# Patient Record
Sex: Male | Born: 1985 | Race: White | Hispanic: No | Marital: Married | Smoking: Never smoker
Health system: Southern US, Community
[De-identification: ages and names within clinical notes are randomized; demographics above are authoritative.]

---
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
## 2011-04-02 ENCOUNTER — Ambulatory Visit (INDEPENDENT_AMBULATORY_CARE_PROVIDER_SITE_OTHER): Payer: Self-pay

## 2011-04-02 ENCOUNTER — Inpatient Hospital Stay (INDEPENDENT_AMBULATORY_CARE_PROVIDER_SITE_OTHER)
Admission: RE | Admit: 2011-04-02 | Discharge: 2011-04-02 | Disposition: A | Discharge status: Home / Self Care | Payer: Self-pay | Source: Ambulatory Visit | Attending: Emergency Medicine | Admitting: Emergency Medicine

## 2011-04-02 DIAGNOSIS — M799 Soft tissue disorder, unspecified: Secondary | ICD-10-CM

## 2011-04-02 DIAGNOSIS — R0789 Other chest pain: Secondary | ICD-10-CM

## 2012-10-14 IMAGING — CR DG CHEST 2V
2 series · 2 of 2 positions shown · non-contrast
Comparison: None.

CLINICAL DATA: Posterior left chest pain.

CHEST - 2 VIEW

[view not recorded (1 of 2)]
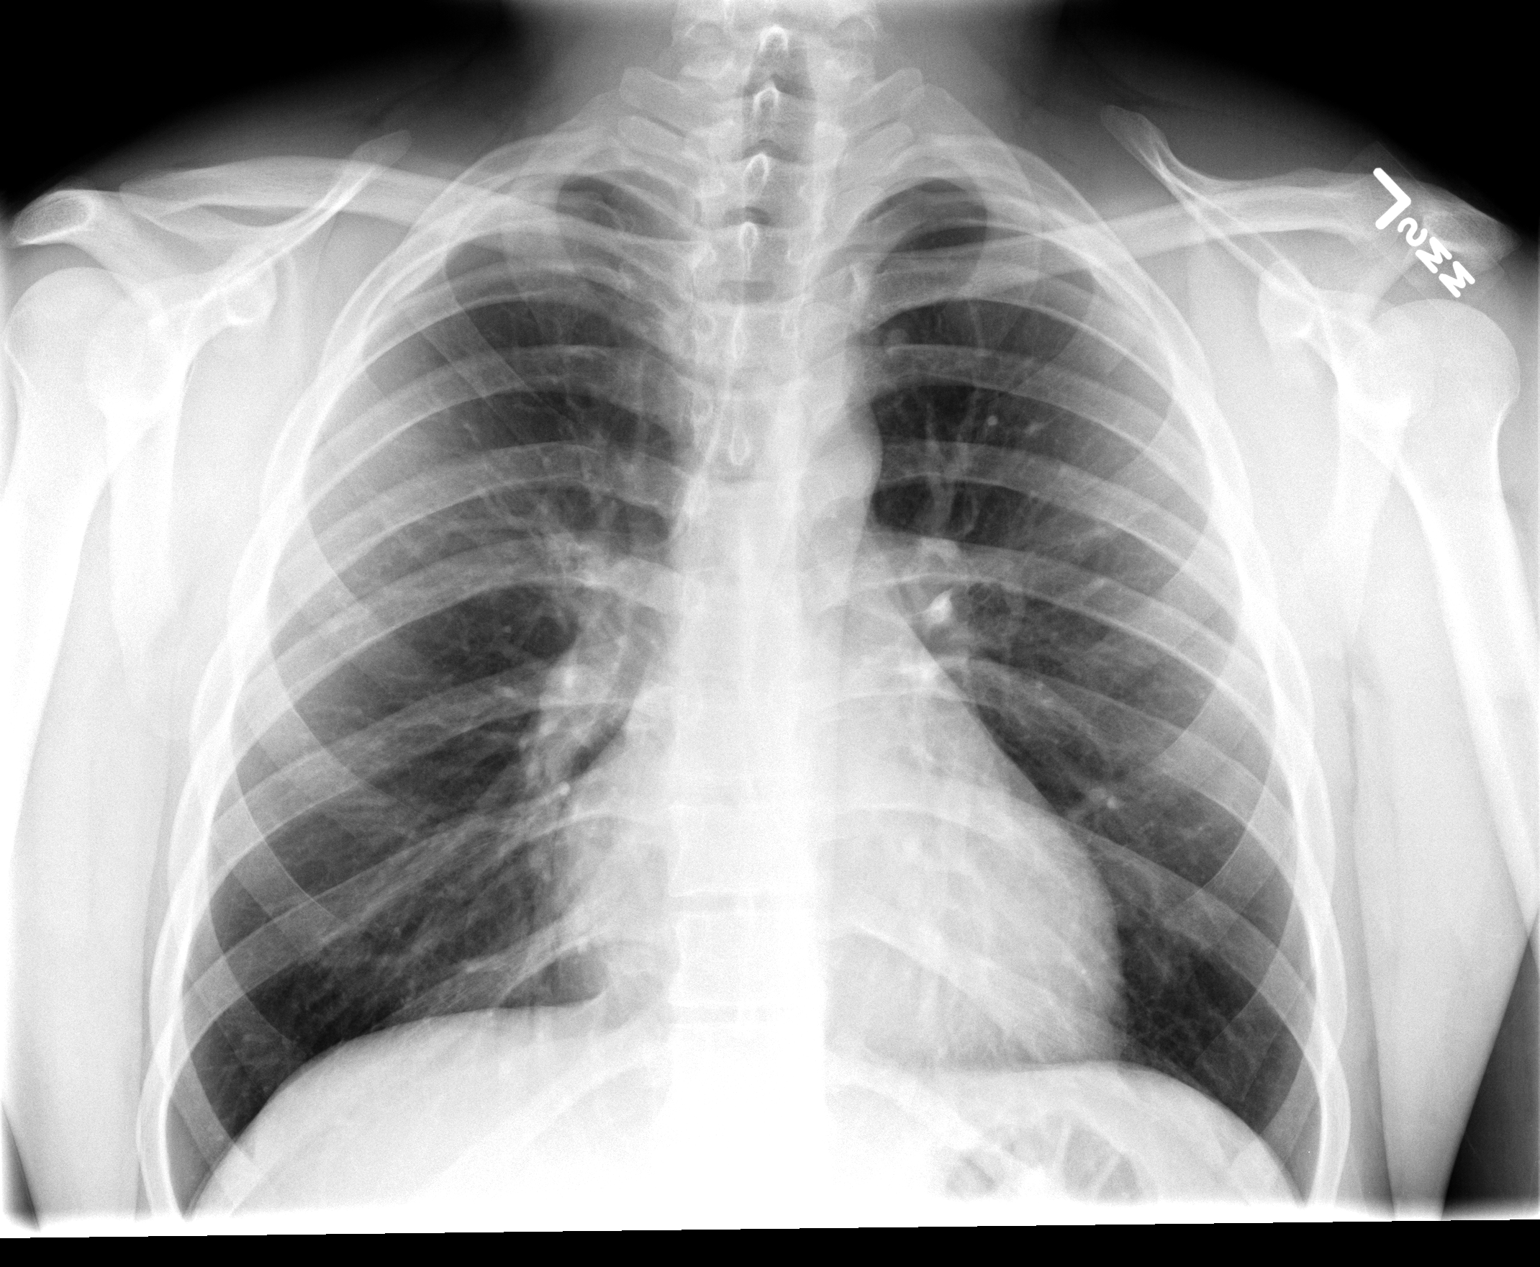

[view not recorded (2 of 2)]
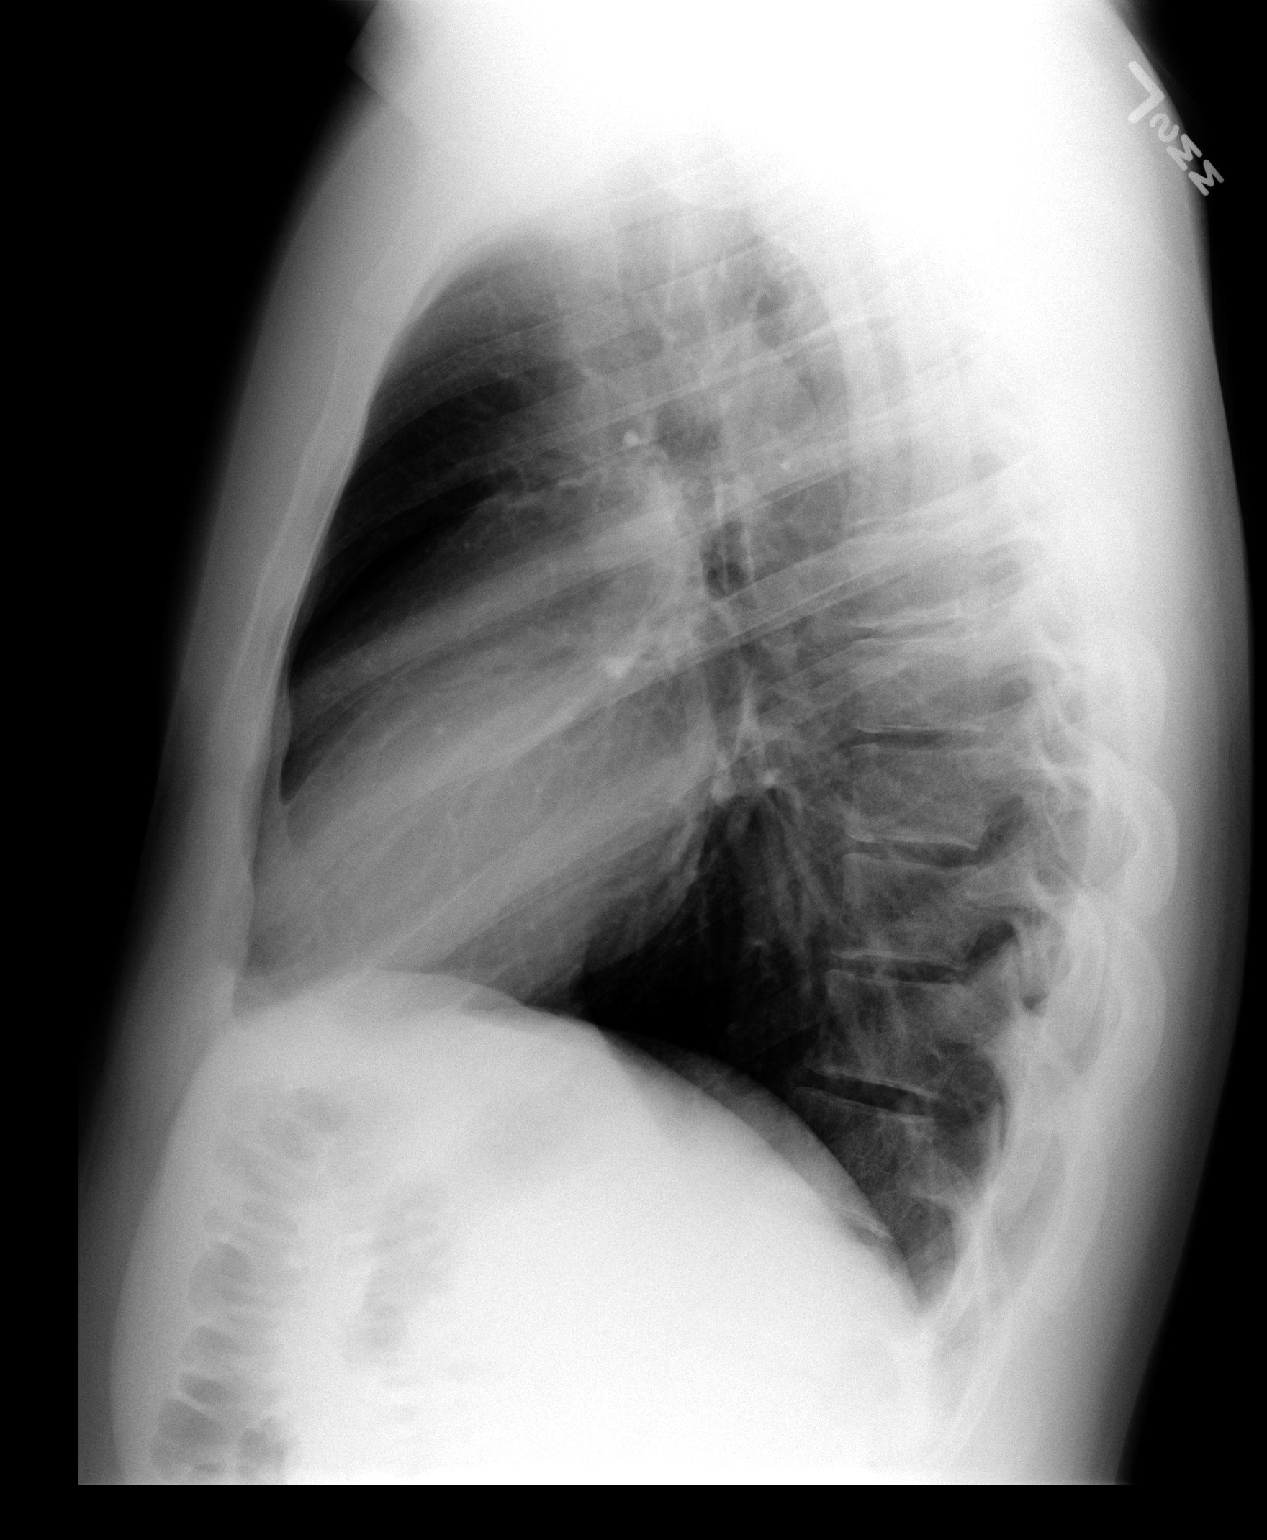

[2 of 2 positions shown; findings below may reference images not displayed]

FINDINGS: The heart size and mediastinal contours are normal. The
lungs are clear. There is no pleural effusion or pneumothorax. No
acute osseous findings are identified.
IMPRESSION: No active cardiopulmonary process.

## 2020-07-17 ENCOUNTER — Ambulatory Visit (HOSPITAL_COMMUNITY)
Admission: EM | Admit: 2020-07-17 | Discharge: 2020-07-17 | Disposition: A | Discharge status: Home / Self Care | Payer: Self-pay

## 2020-07-17 ENCOUNTER — Encounter (HOSPITAL_COMMUNITY): Payer: Self-pay | Admitting: Pediatrics

## 2020-07-17 ENCOUNTER — Other Ambulatory Visit: Payer: Self-pay

## 2020-07-17 ENCOUNTER — Emergency Department (HOSPITAL_COMMUNITY)
Admission: EM | Admit: 2020-07-17 | Discharge: 2020-07-17 | Disposition: A | Discharge status: Home / Self Care | Payer: No Typology Code available for payment source | Attending: Emergency Medicine | Admitting: Emergency Medicine

## 2020-07-17 ENCOUNTER — Encounter (HOSPITAL_COMMUNITY): Payer: Self-pay

## 2020-07-17 ENCOUNTER — Emergency Department (HOSPITAL_COMMUNITY): Payer: No Typology Code available for payment source

## 2020-07-17 DIAGNOSIS — Z5321 Procedure and treatment not carried out due to patient leaving prior to being seen by health care provider: Secondary | ICD-10-CM | POA: Diagnosis not present

## 2020-07-17 DIAGNOSIS — R112 Nausea with vomiting, unspecified: Secondary | ICD-10-CM | POA: Insufficient documentation

## 2020-07-17 DIAGNOSIS — R109 Unspecified abdominal pain: Secondary | ICD-10-CM | POA: Insufficient documentation

## 2020-07-17 LAB — COMPREHENSIVE METABOLIC PANEL
ALT: 19 U/L (ref 0–44)
AST: 32 U/L (ref 15–41)
Albumin: 4.1 g/dL (ref 3.5–5.0)
Alkaline Phosphatase: 28 U/L — ABNORMAL LOW (ref 38–126)
Anion gap: 13 (ref 5–15)
BUN: 16 mg/dL (ref 6–20)
CO2: 22 mmol/L (ref 22–32)
Calcium: 9 mg/dL (ref 8.9–10.3)
Chloride: 98 mmol/L (ref 98–111)
Creatinine, Ser: 1.54 mg/dL — ABNORMAL HIGH (ref 0.61–1.24)
GFR calc Af Amer: >60 mL/min (ref >60)
GFR calc non Af Amer: 59 mL/min — ABNORMAL LOW (ref >60)
Glucose, Bld: 111 mg/dL — ABNORMAL HIGH (ref 70–99)
Potassium: 3.9 mmol/L (ref 3.5–5.1)
Sodium: 133 mmol/L — ABNORMAL LOW (ref 135–145)
Total Bilirubin: 1.2 mg/dL (ref 0.3–1.2)
Total Protein: 7.8 g/dL (ref 6.5–8.1)

## 2020-07-17 LAB — CBC
HCT: 46.1 % (ref 39.0–52.0)
Hemoglobin: 15.3 g/dL (ref 13.0–17.0)
MCH: 28.2 pg (ref 26.0–34.0)
MCHC: 33.2 g/dL (ref 30.0–36.0)
MCV: 84.9 fL (ref 80.0–100.0)
Platelets: 178 10*3/uL (ref 150–400)
RBC: 5.43 MIL/uL (ref 4.22–5.81)
RDW: 12 % (ref 11.5–15.5)
WBC: 6.6 10*3/uL (ref 4.0–10.5)
nRBC: 0 % (ref 0.0–0.2)

## 2020-07-17 LAB — LIPASE, BLOOD: Lipase: 29 U/L (ref 11–51)

## 2020-07-17 MED ORDER — ONDANSETRON 4 MG PO TBDP: 4.0000 mg | ORAL_TABLET | Freq: Once | ORAL | Status: DC | PRN

## 2020-07-17 NOTE — ED Provider Notes (Signed)
Patient evaluated briefly at triage.  He is greater than 1 week history of having tested positive for COVID-19 and is not doing well.  Reports that he is not able to hold food and drinks well, has significant body aches.  In clinic has fevers, tachycardia and pulse oximetry of 93%.  Recommended further evaluation in the emergency room as he is having signs of Covid complications despite being an otherwise healthy individual.   Wallis Bamberg, PA-C 07/17/20 1651

## 2020-07-17 NOTE — ED Notes (Signed)
Patient is being discharged from the Urgent Care and sent to the Emergency Department via POV. Per Urban Gibson, patient is in need of higher level of care due to covid positive, worsening symptoms, febrile, unable to eat or drink x 4 days. Patient is aware and verbalizes understanding of plan of care.  Vitals:   07/17/20 1646  BP: 122/86  Pulse: (!) 110  Resp: 20  Temp: (!) 101.8 F (38.8 C)  SpO2: 93%

## 2020-07-17 NOTE — ED Triage Notes (Signed)
Pt presents with complaints of being on day 8 of covid. Reports he has not been able to hold any food or drink down in 4 days. Reports feeling very dehydrated and fatigued. States "I feel like my muscles are locking up."

## 2020-07-17 NOTE — ED Triage Notes (Signed)
Patient stated diagnose w/ Covid last Monday and has symptoms since last Wednesday. C/O NV + abdominal pain.
